# Patient Record
Sex: Male | Born: 1950 | Race: White | Hispanic: No | Smoking: Former smoker
Health system: Southern US, Community
[De-identification: ages and names within clinical notes are randomized; demographics above are authoritative.]

## PROBLEM LIST (undated history)

## (undated) DIAGNOSIS — J449 Chronic obstructive pulmonary disease, unspecified: Secondary | ICD-10-CM

## (undated) HISTORY — PX: NO PAST SURGERIES: SHX2092

---

## 2020-12-17 ENCOUNTER — Other Ambulatory Visit: Payer: Self-pay

## 2020-12-17 ENCOUNTER — Ambulatory Visit
Admission: EM | Admit: 2020-12-17 | Discharge: 2020-12-17 | Disposition: A | Discharge status: Home / Self Care | Payer: Medicare HMO | Attending: Family Medicine | Admitting: Family Medicine

## 2020-12-17 DIAGNOSIS — L270 Generalized skin eruption due to drugs and medicaments taken internally: Secondary | ICD-10-CM | POA: Diagnosis not present

## 2020-12-17 HISTORY — DX: Chronic obstructive pulmonary disease, unspecified: J44.9

## 2020-12-17 MED ORDER — PREDNISONE 10 MG PO TABS: ORAL_TABLET | ORAL | 0 refills | Status: DC

## 2020-12-17 MED ORDER — CETIRIZINE HCL 10 MG PO TABS: 10.0000 mg | ORAL_TABLET | Freq: Every day | ORAL | 0 refills | Status: DC

## 2020-12-17 NOTE — ED Triage Notes (Addendum)
Patient states that he has a rash that started 2 days ago. Patient states that he started an antibiotic around 7 days ago. States that he stopped taking it since he believes the rash may have been caused by antibiotic. The rash is all over his body. Patient states that he took some of his daughter in laws medication for his tooth infection, states that the medication was doxycycline.

## 2020-12-18 NOTE — ED Provider Notes (Signed)
[facility]    CSN: [id number] Arrival date & time: 12/17/20  1544      History   Chief Complaint Chief Complaint  Patient presents with  . Rash  . Allergic Reaction   HPI  70 year old male presents with the above complaints.  Patient reports that his rash started 2 days ago.  He recently took several days of an antibiotic that he got from his daughter.  Patient reports that he took clindamycin for a "gum issue".  He now has a diffuse, raised rash.  It is essentially located everywhere.  He has had some itching.  No relieving factors.  No other reported symptoms.  Past Medical History:  Diagnosis Date  . COPD (chronic obstructive pulmonary disease) ([place])    Past Surgical History:  Procedure Laterality Date  . NO PAST SURGERIES      Home Medications    Prior to Admission medications   Medication Sig Start Date End Date Taking? Authorizing Provider  Albuterol Sulfate, sensor, (PROAIR DIGIHALER) 108 (90 Base) MCG/ACT AEPB Inhale into the lungs.   Yes [provider]  cetirizine (ZYRTEC ALLERGY) 10 MG tablet Take 1 tablet (10 mg total) by mouth daily. 12/17/20  Yes Lavin Petteway G, DO  ipratropium-albuterol (DUONEB) 0.5-2.5 (3) MG/3ML SOLN Take by nebulization. 11/15/20  Yes [provider]  OXYGEN Inhale into the lungs. 2-5 liters depending on activity   Yes [provider]  predniSONE (DELTASONE) 10 MG tablet 50 mg daily x 2 days, then 40 mg daily x 2 days, then 30 mg daily x 2 days, then 20 mg daily x 2 days, then 10 mg daily x 2 days. 12/17/20  Yes Coral Spikes, DO    Family History Family History  Problem Relation Age of Onset  . Breast cancer Mother   . Alcoholism Father     Social History Social History   Tobacco Use  . Smoking status: Current Every Day Smoker    Packs/day: 0.50    Types: Cigarettes  . Smokeless tobacco: Never Used  Vaping Use  . Vaping Use: Never used  Substance Use Topics  . Alcohol use: Yes     Comment: beers daily  . Drug use: Not Currently     Allergies   Sulfa antibiotics   Review of Systems Review of Systems  Constitutional: Negative for fever.  Skin: Positive for rash.    Physical Exam Triage Vital Signs ED Triage Vitals [12/17/20 1754]  Enc Vitals Group     BP      Pulse      Resp      Temp      Temp src      SpO2      Weight 190 lb (86.2 kg)     Height 5\' 8"  (1.727 m)     Head Circumference      Peak Flow      Pain Score 3     Pain Loc      Pain Edu?      Excl. in [place]?    Updated Vital Signs Ht 5\' 8"  (1.727 m)   Wt 86.2 kg   BMI 28.89 kg/m   Visual Acuity Right Eye Distance:   Left Eye Distance:   Bilateral Distance:    Right Eye Near:   Left Eye Near:    Bilateral Near:     Physical Exam Vitals and nursing note reviewed.  Constitutional:      Comments: Chronically ill-appearing.  Nasal  cannula oxygen in place.  HENT:     Head: Normocephalic and atraumatic.  Eyes:     General:        Right eye: No discharge.        Left eye: No discharge.     Conjunctiva/sclera: Conjunctivae normal.  Pulmonary:     Effort: No respiratory distress.  Skin:    Comments: Diffuse, raised, erythematous rash.  Neurological:     Mental Status: He is alert.  Psychiatric:        Mood and Affect: Mood normal.        Behavior: Behavior normal.    UC Treatments / Results  Labs (all labs ordered are listed, but only abnormal results are displayed) Labs Reviewed - No data to display  EKG   Radiology No results found.  Procedures Procedures (including critical care time)  Medications Ordered in UC Medications - No data to display  Initial Impression / Assessment and Plan / UC Course  I have reviewed the triage vital signs and the nursing notes.  Pertinent labs & imaging results that were available during my care of the patient were reviewed by me and considered in my medical decision making (see chart for details).    70 year old male  presents with a drug rash. Stop clindamycin. Treating with zyrtec and prednisone.   Final Clinical Impressions(s) / UC Diagnoses   Final diagnoses:  Drug rash   Discharge Instructions   None    ED Prescriptions    Medication Sig Dispense Auth. Provider   predniSONE (DELTASONE) 10 MG tablet 50 mg daily x 2 days, then 40 mg daily x 2 days, then 30 mg daily x 2 days, then 20 mg daily x 2 days, then 10 mg daily x 2 days. 30 tablet Nabil Bubolz G, DO   cetirizine (ZYRTEC ALLERGY) 10 MG tablet Take 1 tablet (10 mg total) by mouth daily. 14 tablet Coral Spikes, DO     PDMP not reviewed this encounter.   Thersa Salt [place], [place] 12/18/20 [phone]

## 2021-01-05 ENCOUNTER — Institutional Professional Consult (permissible substitution): Payer: Self-pay | Admitting: Pulmonary Disease

## 2021-01-05 NOTE — Progress Notes (Deleted)
   Synopsis: Referred in *** for *** by No ref. provider found  Subjective:   PATIENT ID: Jimmy Welch GENDER: male DOB: Sep 17, 1951, MRN: [id number]  No chief complaint on file.   HPI  ***  No past medical history on file.   No family history on file.   *** The histories are not reviewed yet. Please review them in the "History" navigator section and refresh this [place].  Social History   Socioeconomic History  . Marital status: Not on file    Spouse name: Not on file  . Number of children: Not on file  . Years of education: Not on file  . Highest education level: Not on file  Occupational History  . Not on file  Tobacco Use  . Smoking status: Not on file  . Smokeless tobacco: Not on file  Substance and Sexual Activity  . Alcohol use: Not on file  . Drug use: Not on file  . Sexual activity: Not on file  Other Topics Concern  . Not on file  Social History Narrative  . Not on file   Social Determinants of Health   Financial Resource Strain: Not on file  Food Insecurity: Not on file  Transportation Needs: Not on file  Physical Activity: Not on file  Stress: Not on file  Social Connections: Not on file  Intimate Partner Violence: Not on file     Not on File   No outpatient medications prior to visit.   No facility-administered medications prior to visit.    ROS   Objective:  Physical Exam   There were no vitals filed for this visit.   on *** LPM *** RA BMI Readings from Last 3 Encounters:  No data found for BMI   Wt Readings from Last 3 Encounters:  No data found for Wt     CBC No results found for: WBC, RBC, HGB, HCT, PLT, MCV, MCH, MCHC, RDW, LYMPHSABS, MONOABS, EOSABS, BASOSABS  ***  Chest Imaging: ***  Pulmonary Functions Testing Results: No flowsheet data found.  FeNO: ***  Pathology: ***  Echocardiogram: ***  Heart Catheterization: ***    Assessment & Plan:   No diagnosis found.  Discussion: ***  No current  outpatient medications on file.  I spent *** minutes dedicated to the care of this patient on the date of this encounter to include pre-visit review of records, face-to-face time with the patient discussing conditions above, post visit ordering of testing, clinical documentation with the electronic health record, making appropriate referrals as documented, and communicating necessary findings to members of the patients care team.   Garner Nash, DO [facility] 01/05/2021 10:57 AM

## 2021-02-08 ENCOUNTER — Encounter: Payer: Self-pay | Admitting: Internal Medicine

## 2021-02-08 ENCOUNTER — Other Ambulatory Visit: Payer: Self-pay

## 2021-02-08 ENCOUNTER — Ambulatory Visit: Payer: Medicare HMO | Admitting: Internal Medicine

## 2021-02-08 ENCOUNTER — Telehealth: Payer: Self-pay | Admitting: Internal Medicine

## 2021-02-08 VITALS — BP 130/78 | HR 97 | Temp 97.3°F | Ht 68.0 in | Wt 219.2 lb

## 2021-02-08 DIAGNOSIS — F17210 Nicotine dependence, cigarettes, uncomplicated: Secondary | ICD-10-CM

## 2021-02-08 DIAGNOSIS — F17200 Nicotine dependence, unspecified, uncomplicated: Secondary | ICD-10-CM

## 2021-02-08 DIAGNOSIS — J449 Chronic obstructive pulmonary disease, unspecified: Secondary | ICD-10-CM

## 2021-02-08 NOTE — Progress Notes (Signed)
Name: Jimmy Welch MRN: [id number] DOB: Aug 07, 1951     CONSULTATION DATE: 02/08/2021    CHIEF COMPLAINT:SOB  HISTORY OF PRESENT ILLNESS: 70 year old white male with a newly diagnosis of COPD since 2020 with an extensive medical history and admission to the hospital in the intensive care unit in October 2020 for severe COPD exacerbation from pneumonia  On discharge patient has significant amount of respiratory dysfunction and respiratory insufficiency requiring 4 to 5 L of oxygen with exertion along with 1 to 2 L at nighttime  Patient has tried multiple traditional inhaler therapies which did not work and now has found significant amount of respiratory relief with  duo nebs every 6 hours   No exacerbation at this time No evidence of heart failure at this time No evidence or signs of infection at this time No respiratory distress No fevers, chills, nausea, vomiting, diarrhea No evidence of lower extremity edema No evidence hemoptysis  At this time patient has progressive respiratory insufficiency and poor respiratory effort with dyspnea exertion  I will need to get his records from Dr. Azucena Freed pulmonary medicine from [place] Once we receive records will obtain further testing if needed   Smoking Assessment and Cessation Counseling Upon further questioning, Patient smokes 1 ppd I have advised patient to quit/stop smoking as soon as possible due to high risk for multiple medical problems  Patient is willing to quit smoking  I have advised patient that we can assist and have options of Nicotine replacement therapy. I also advised patient on behavioral therapy and can provide oral medication therapy in conjunction with the other therapies Follow up next Office visit  for assessment of smoking cessation Smoking cessation counseling advised for 4 minutes      PAST MEDICAL HISTORY :   has a past medical history of COPD (chronic obstructive pulmonary disease) ([place]).   has a past surgical history that includes No past surgeries. Prior to Admission medications   Medication Sig Start Date End Date Taking? Authorizing Provider  Albuterol Sulfate, sensor, (PROAIR DIGIHALER) 108 (90 Base) MCG/ACT AEPB Inhale into the lungs.   Yes [provider]  ipratropium-albuterol (DUONEB) 0.5-2.5 (3) MG/3ML SOLN Take by nebulization. 11/15/20  Yes [provider]  OXYGEN Inhale into the lungs. 2-5 liters depending on activity   Yes [provider]  cetirizine (ZYRTEC ALLERGY) 10 MG tablet Take 1 tablet (10 mg total) by mouth daily. Patient not taking: Reported on 02/08/2021 12/17/20   Coral Spikes, DO   Allergies  Allergen Reactions  . Sulfa Antibiotics     FAMILY HISTORY:  family history includes Alcoholism in his father; Breast cancer in his mother. SOCIAL HISTORY:  reports that he has been smoking cigarettes. He has been smoking about 0.50 packs per day. He has never used smokeless tobacco. He reports current alcohol use. He reports previous drug use.    Review of Systems:  Gen:  Denies  fever, sweats, chills weigh loss  HEENT: Denies blurred vision, double vision, ear pain, eye pain, hearing loss, nose bleeds, sore throat Cardiac:  No dizziness, chest pain or heaviness, chest tightness,edema, No JVD Resp:   No cough, -sputum production, +shortness of breath,-wheezing, -hemoptysis,  Gi: Denies swallowing difficulty, stomach pain, nausea or vomiting, diarrhea, constipation, bowel incontinence Gu:  Denies bladder incontinence, burning urine Ext:   Denies Joint pain, stiffness or swelling Skin: Denies  skin rash, easy bruising or bleeding or hives Endoc:  Denies polyuria, polydipsia , polyphagia or weight change Psych:  Denies depression, insomnia or hallucinations  Other:  All other systems negative    BP 130/78 (BP Location: Left Arm, Cuff Size: Normal)   Pulse 97   Temp (!) 97.3 F (36.3 C) (Temporal)   Ht 5\' 8"  (1.727 m)    Wt 219 lb 3.2 oz (99.4 kg)   SpO2 99%   BMI 33.33 kg/m    Physical Examination:   GENERAL:NAD, no fevers, chills, no weakness no fatigue HEAD: Normocephalic, atraumatic.  EYES: PERLA, EOMI No scleral icterus.  MOUTH: Moist mucosal membrane.  EAR, NOSE, THROAT: Clear without exudates. No external lesions.  NECK: Supple.  PULMONARY: CTA B/L no wheezing, rhonchi, crackles CARDIOVASCULAR: S1 and S2. Regular rate and rhythm. No murmurs GASTROINTESTINAL: Soft, nontender, nondistended. Positive bowel sounds.  MUSCULOSKELETAL: No swelling, clubbing, or edema.  NEUROLOGIC: No gross focal neurological deficits. 5/5 strength all extremities SKIN: No ulceration, lesions, rashes, or cyanosis.  PSYCHIATRIC: Insight, judgment intact. -depression -anxiety ALL OTHER ROS ARE NEGATIVE   MEDICATIONS: I have reviewed all medications and confirmed regimen as documented      ASSESSMENT AND PLAN SYNOPSIS 70 year old morbidly obese white male with significant respiratory insufficiency from COPD with a significant medical history of acute exacerbation with pneumonia and requiring the ventilator several years ago with chronic persistent shortness of breath and dyspnea exertion and poor respiratory insufficiency requiring continued oxygen therapy  COPD Obtain records from previous pulmonary office Patient with significant respiratory compromise from COPD Nebulized therapy is the only therapy that works at this time Traditional inhaler therapy has been tried and has not worked [employer] every 6 hours  Chronic hypoxic respiratory failure from COPD Continue oxygen as prescribed Patient uses and benefits from oxygen therapy He needs this for survival Patient is asking for portable oxygen concentrator   Smoking cessation strongly advised  Patient meets criteria for lung cancer screening protocol  COVID-19 EDUCATION: The signs and symptoms of COVID-19 were discussed with the patient and how to seek  care for testing.  The importance of social distancing was discussed today. Hand Washing Techniques and avoid touching face was advised.     MEDICATION ADJUSTMENTS/LABS AND TESTS ORDERED: Obtain records from pulmonary office Continue oxygen as prescribed Portable oxygen concentrator Continue nebulizer therapy with DuoNeb's   CURRENT MEDICATIONS REVIEWED AT LENGTH WITH PATIENT TODAY   Patient satisfied with Plan of action and management. All questions answered  Follow up in 6 months Total time spent 48 minutes   Corrin Parker, M.D.  [facility]  Medical Director [place] Director [facility] Cardio-Pulmonary Department

## 2021-02-08 NOTE — Patient Instructions (Addendum)
Continue oxygen as prescribed Continue nebulized therapy with duo nebs every 6 hours Patient meets criteria for lung cancer screening protocol Assess patient's DME company for portable oxygen concentrator Obtain records from Dr. Azucena Freed pulmonary medicine office  Lung cancer screening referral

## 2021-02-08 NOTE — Telephone Encounter (Signed)
Record request has been faxed to [facility].  Will await records.

## 2021-02-15 NOTE — Telephone Encounter (Signed)
Re faxed request to [employer].

## 2021-02-23 ENCOUNTER — Telehealth: Payer: Self-pay | Admitting: *Deleted

## 2021-02-23 DIAGNOSIS — Z122 Encounter for screening for malignant neoplasm of respiratory organs: Secondary | ICD-10-CM

## 2021-02-23 DIAGNOSIS — Z87891 Personal history of nicotine dependence: Secondary | ICD-10-CM

## 2021-02-23 DIAGNOSIS — F17200 Nicotine dependence, unspecified, uncomplicated: Secondary | ICD-10-CM

## 2021-02-23 NOTE — Telephone Encounter (Signed)
Received referral for initial lung cancer screening scan. Contacted patient and obtained smoking history,(current 40 pack year) as well as answering questions related to screening process. Patient denies signs of lung cancer such as weight loss or hemoptysis. Patient denies comorbidity that would prevent curative treatment if lung cancer were found. Patient is scheduled for shared decision making visit and CT scan on 03/04/21.

## 2021-03-02 NOTE — Telephone Encounter (Signed)
Records have not been received.   I have spoken to caleb with [facility] health and requested update. Hetty Blend stated that request was received and forwarded to [employer].

## 2021-03-04 ENCOUNTER — Ambulatory Visit
Admission: RE | Admit: 2021-03-04 | Discharge: 2021-03-04 | Disposition: A | Discharge status: Home / Self Care | Payer: Medicare HMO | Source: Ambulatory Visit | Attending: Nurse Practitioner | Admitting: Nurse Practitioner

## 2021-03-04 ENCOUNTER — Other Ambulatory Visit: Payer: Self-pay

## 2021-03-04 ENCOUNTER — Inpatient Hospital Stay: Payer: Medicare HMO | Attending: Nurse Practitioner | Admitting: Nurse Practitioner

## 2021-03-04 DIAGNOSIS — Z122 Encounter for screening for malignant neoplasm of respiratory organs: Secondary | ICD-10-CM | POA: Diagnosis present

## 2021-03-04 DIAGNOSIS — Z87891 Personal history of nicotine dependence: Secondary | ICD-10-CM | POA: Insufficient documentation

## 2021-03-04 DIAGNOSIS — F17200 Nicotine dependence, unspecified, uncomplicated: Secondary | ICD-10-CM | POA: Diagnosis present

## 2021-03-04 NOTE — Progress Notes (Signed)
Virtual Visit via Video Enabled Telemedicine Note   I connected with Jimmy Welch on 03/04/21 at 11:30 am EST by video enabled telemedicine visit and verified that I am speaking with the correct person using two identifiers.   I discussed the limitations, risks, security and privacy concerns of performing an evaluation and management service by telemedicine and the availability of in-person appointments. I also discussed with the patient that there may be a patient responsible charge related to this service. The patient expressed understanding and agreed to proceed.   Other persons participating in the visit and their role in the encounter: Burgess Estelle, RN- checking in patient & navigation  Patient's location: home  Provider's location: Clinic  Chief Complaint: Low Dose CT Screening  Patient agreed to evaluation by telemedicine to discuss shared decision making for consideration of low dose CT lung cancer screening.    In accordance with CMS guidelines, patient has met eligibility criteria including age, absence of signs or symptoms of lung cancer.  Social History   Tobacco Use  . Smoking status: Current Every Day Smoker    Packs/day: 1.00    Years: 40.00    Pack years: 40.00    Types: Cigarettes  . Smokeless tobacco: Never Used  . Tobacco comment: 1 pack per week--02/08/2021  Substance Use Topics  . Alcohol use: Yes    Comment: beers daily     A shared decision-making session was conducted prior to the performance of CT scan. This includes one or more decision aids, includes benefits and harms of screening, follow-up diagnostic testing, over-diagnosis, false positive rate, and total radiation exposure.   Counseling on the importance of adherence to annual lung cancer LDCT screening, impact of co-morbidities, and ability or willingness to undergo diagnosis and treatment is imperative for compliance of the program.   Counseling on the importance of continued smoking  cessation for former smokers; the importance of smoking cessation for current smokers, and information about tobacco cessation interventions have been given to patient including [place] and [employer] Quit [place] programs.   Written order for lung cancer screening with LDCT has been given to the patient and any and all questions have been answered to the best of my abilities.    Yearly follow up will be coordinated by Burgess Estelle, Thoracic Navigator.  I discussed the assessment and treatment plan with the patient. The patient was provided an opportunity to ask questions and all were answered. The patient agreed with the plan and demonstrated an understanding of the instructions.   The patient was advised to call back or seek an in-person evaluation if the symptoms worsen or if the condition fails to improve as anticipated.   I provided 15 minutes of face-to-face video visit time dedicated to the care of this patient on the date of this encounter to include pre-visit review of smoking history, face-to-face time with the patient, and post visit ordering of testing/documentation.   Beckey Rutter, DNP, AGNP-C [place] at [facility] [phone] (clinic)

## 2021-03-09 NOTE — Telephone Encounter (Signed)
Lm for [facility] health medical records department.

## 2021-03-11 ENCOUNTER — Encounter: Payer: Self-pay | Admitting: *Deleted

## 2021-03-17 NOTE — Telephone Encounter (Signed)
Records have not been received.  Lm for [facility].

## 2021-03-18 NOTE — Telephone Encounter (Signed)
Spoke to [employer] records and requested records.

## 2021-03-30 NOTE — Telephone Encounter (Signed)
Records have been received and placed in Dr. Zoila Shutter folder

## 2021-03-31 ENCOUNTER — Telehealth: Payer: Self-pay | Admitting: Internal Medicine

## 2021-03-31 DIAGNOSIS — Z0289 Encounter for other administrative examinations: Secondary | ICD-10-CM

## 2021-04-05 NOTE — Telephone Encounter (Signed)
Kathlee Nations dropped charge - called and collected payment over the phone form patient. Mailed forms to [place] [employer] in envelope provided, mailed copy to patient per his request. -pr

## 2021-06-30 ENCOUNTER — Encounter: Payer: Self-pay | Admitting: Family Medicine

## 2021-08-25 ENCOUNTER — Other Ambulatory Visit: Payer: Self-pay

## 2021-08-25 ENCOUNTER — Encounter: Payer: Self-pay | Admitting: Gastroenterology

## 2021-08-25 ENCOUNTER — Ambulatory Visit: Payer: Medicare Other | Admitting: Gastroenterology

## 2021-08-25 VITALS — BP 143/72 | HR 85 | Temp 98.0°F | Ht 68.0 in | Wt 208.0 lb

## 2021-08-25 DIAGNOSIS — Z8709 Personal history of other diseases of the respiratory system: Secondary | ICD-10-CM | POA: Diagnosis not present

## 2021-08-25 DIAGNOSIS — R195 Other fecal abnormalities: Secondary | ICD-10-CM

## 2021-08-25 MED ORDER — NA SULFATE-K SULFATE-MG SULF 17.5-3.13-1.6 GM/177ML PO SOLN: 354.0000 mL | Freq: Once | ORAL | 0 refills | Status: AC

## 2021-08-25 NOTE — Patient Instructions (Signed)
We will obtain Pulmonology Clearance from your pulmonologist and then we will call you when you could have your colonoscopy set-up.

## 2021-08-25 NOTE — Progress Notes (Signed)
Jonathon Bellows MD, MRCP(U.K) [address]  [place]  [place], [place] [address]  Main: [phone]  Fax: [phone]   Gastroenterology Consultation  Referring Provider:     [name], [place] Primary Care Physician:  [name], [place] Primary Gastroenterologist:  Dr. Jonathon Bellows  Reason for Consultation:   Blood in stool  HPI:   Jimmy Welch is a 70 y.o. y/o male referred for consultation & management  by  [name], [place].    On 06/17/2021 stool occult testing was positive for blood.  He denies any blood in the stool.  No change in bowel habits.  Last colonoscopy was 10 years back.  Denies any polyps in the last procedure.  He is on oxygen for the past 18 months 24 hours a day.  Was commenced on the same after bad episode of pneumonia.  Denies any shortness of breath at rest.   Past Medical History:  Diagnosis Date   COPD (chronic obstructive pulmonary disease) ([place])     Past Surgical History:  Procedure Laterality Date   NO PAST SURGERIES      Prior to Admission medications   Medication Sig Start Date End Date Taking? Authorizing Provider  Albuterol Sulfate, sensor, (PROAIR DIGIHALER) 108 (90 Base) MCG/ACT AEPB Inhale into the lungs.   Yes [provider]  baclofen (LIORESAL) 10 MG tablet Take 10 mg by mouth 3 (three) times daily. 07/12/21  Yes [provider]  cetirizine (ZYRTEC ALLERGY) 10 MG tablet Take 1 tablet (10 mg total) by mouth daily. 12/17/20  Yes Cook, Jayce G, DO  ipratropium-albuterol (DUONEB) 0.5-2.5 (3) MG/3ML SOLN Take by nebulization. 11/15/20  Yes [provider]  lisinopril (ZESTRIL) 20 MG tablet Take 20 mg by mouth daily. 08/05/21  Yes [provider]  OXYGEN Inhale into the lungs. 2-5 liters depending on activity   Yes [provider]    Family History  Problem Relation Age of Onset   Breast cancer Mother    Alcoholism Father      Social History   Tobacco Use   Smoking status: Every Day     Packs/day: 1.00    Years: 40.00    Pack years: 40.00    Types: Cigarettes   Smokeless tobacco: Never   Tobacco comments:    1 pack per week--02/08/2021  Vaping Use   Vaping Use: Never used  Substance Use Topics   Alcohol use: Yes    Comment: beers daily   Drug use: Not Currently    Allergies as of 08/25/2021 - Review Complete 08/25/2021  Allergen Reaction Noted   Sulfa antibiotics  12/17/2020    Review of Systems:    All systems reviewed and negative except where noted in HPI.   Physical Exam:  BP (!) 143/72 (BP Location: Left Arm, Patient Position: Sitting, Cuff Size: Normal)   Pulse 85   Temp 98 F (36.7 C) (Oral)   Ht 5\' 8"  (1.727 m)   Wt 208 lb (94.3 kg)   BMI 31.63 kg/m  No LMP for male patient. Psych:  Alert and cooperative. Normal mood and affect. General:   Alert,  Well-developed, well-nourished, pleasant and cooperative in NAD Head:  Normocephalic and atraumatic. Eyes:  Sclera clear, no icterus.   Conjunctiva pink. Lungs: On oxygen with a cylinder by side.  Decreased air entry bilaterally but equal.  Using some accessory muscles of respiration to breathe. Heart:  Regular rate and rhythm; no murmurs, clicks, rubs, or gallops. Abdomen:  Normal  bowel sounds.  No bruits.  Soft, non-tender and non-distended without masses, hepatosplenomegaly or hernias noted.  No guarding or rebound tenderness.    Neurologic:  Alert and oriented x3;  grossly normal neurologically. Psych:  Alert and cooperative. Normal mood and affect.  Imaging Studies: No results found.  Assessment and Plan:   Jimmy Welch is a 70 y.o. y/o male has been referred for here today for stool test that was positive for blood. Unclear why it was checked.  Patient was not sure.  I explained to him that with his severe COPD and a procedure such as a colonoscopy requiring anesthesia the risk would definitely be higher than usual due to his impaired functioning of the lungs.  He decided to go ahead  with the procedure.  We will obtain pulmonary clearance and proceed with the procedure.  I have discussed alternative options, risks & benefits,  which include, but are not limited to, bleeding, infection, perforation,respiratory complication & drug reaction.  The patient agrees with this plan & written consent will be obtained.    Follow up in as needed  Dr Jonathon Bellows MD,MRCP(U.K)

## 2021-09-08 ENCOUNTER — Ambulatory Visit: Payer: Medicare HMO | Admitting: Adult Health

## 2021-10-03 ENCOUNTER — Encounter: Payer: Self-pay | Admitting: Primary Care

## 2021-10-03 ENCOUNTER — Other Ambulatory Visit: Payer: Self-pay

## 2021-10-03 ENCOUNTER — Ambulatory Visit
Admission: RE | Admit: 2021-10-03 | Discharge: 2021-10-03 | Disposition: A | Discharge status: Home / Self Care | Payer: Medicare Other | Source: Ambulatory Visit | Attending: Primary Care | Admitting: Primary Care

## 2021-10-03 ENCOUNTER — Ambulatory Visit (INDEPENDENT_AMBULATORY_CARE_PROVIDER_SITE_OTHER): Payer: Medicare Other | Admitting: Primary Care

## 2021-10-03 VITALS — BP 128/60 | HR 77 | Temp 97.7°F | Ht 68.0 in | Wt 202.0 lb

## 2021-10-03 DIAGNOSIS — J449 Chronic obstructive pulmonary disease, unspecified: Secondary | ICD-10-CM | POA: Diagnosis not present

## 2021-10-03 DIAGNOSIS — Z01811 Encounter for preprocedural respiratory examination: Secondary | ICD-10-CM

## 2021-10-03 DIAGNOSIS — J9611 Chronic respiratory failure with hypoxia: Secondary | ICD-10-CM | POA: Diagnosis not present

## 2021-10-03 NOTE — Assessment & Plan Note (Signed)
-   Stable interval - Continue ipratropium-albuterol nebulizer QID - Strongly encourage he quit smoking - Needs pre-op CXR and due for PFTs - FU in 3 months or sooner if needed

## 2021-10-03 NOTE — Progress Notes (Signed)
@Patient  ID: Jimmy Welch, male    DOB: 20-Apr-1951, 70 y.o.   MRN: [id number]  Chief Complaint  Patient presents with   Follow-up    Referring provider: [name], PA  HPI: 70 year old male, current everyday smoker.  Past medical history significant for COPD, chronic respiratory failure.  Patient of Dr. Mortimer Fries, last seen for initial consult on 02/08/2021.  10/03/2021- Interim hx  Patient presents today for overdue 6 month follow-up. He is doing well today, no acute complaints. His breathing has significantly improved over the last several months. He has lost 20 lbs since August by walking on his treadmill. He is not able to afford traditional inhalers. He uses duoneb 3-4 times a day.  He feels this regimen has been working well for him. He is on continuous oxygen, uses 2L at rest and 5L with moderate-heavy exertion.  He has an intermittent chronic cough and dry throat, needs several cough drop throughout the day. He is still smoking, 1 pack last him a week.He is needing letter for [facility] today stating that he is disabled and needs handicap placard form. He does not drive, his son will drive him. Needs clearance for colonoscopy, date TBD.    Allergies  Allergen Reactions   Sulfa Antibiotics     Immunization History  Administered Date(s) Administered   Moderna SARS-COV2 Booster Vaccination 01/05/2021   Moderna Sars-Covid-2 Vaccination 03/10/2020, 04/07/2020    Past Medical History:  Diagnosis Date   COPD (chronic obstructive pulmonary disease) ([place])     Tobacco History: Social History   Tobacco Use  Smoking Status Every Day   Packs/day: 1.00   Years: 40.00   Pack years: 40.00   Types: Cigarettes  Smokeless Tobacco Never  Tobacco Comments   1 pack per week--02/08/2021   Ready to quit: Not Answered Counseling given: Not Answered Tobacco comments: 1 pack per week--02/08/2021   Outpatient Medications Prior to Visit  Medication Sig Dispense Refill   Albuterol  Sulfate, sensor, (PROAIR DIGIHALER) 108 (90 Base) MCG/ACT AEPB Inhale into the lungs.     baclofen (LIORESAL) 10 MG tablet Take 10 mg by mouth 3 (three) times daily.     cetirizine (ZYRTEC ALLERGY) 10 MG tablet Take 1 tablet (10 mg total) by mouth daily. 14 tablet 0   ipratropium-albuterol (DUONEB) 0.5-2.5 (3) MG/3ML SOLN Take by nebulization.     lisinopril (ZESTRIL) 20 MG tablet Take 20 mg by mouth daily.     methylPREDNISolone (MEDROL) 4 MG tablet Take by mouth.     OXYGEN Inhale into the lungs. 2-5 liters depending on activity     No facility-administered medications prior to visit.   Review of Systems  Review of Systems  Constitutional: Negative.   HENT: Negative.    Respiratory:  Positive for cough. Negative for chest tightness, shortness of breath and wheezing.     Physical Exam  BP 128/60 (BP Location: Left Arm, Patient Position: Sitting, Cuff Size: Normal)   Pulse 77   Temp 97.7 F (36.5 C) (Oral)   Ht 5\' 8"  (1.727 m)   Wt 202 lb (91.6 kg)   SpO2 95%   BMI 30.71 kg/m  Physical Exam Constitutional:      General: He is not in acute distress.    Appearance: Normal appearance. He is not ill-appearing.  HENT:     Head: Normocephalic and atraumatic.     Mouth/Throat:     Mouth: Mucous membranes are moist.     Pharynx: Oropharynx is clear.  Cardiovascular:  Rate and Rhythm: Normal rate and regular rhythm.  Pulmonary:     Effort: Pulmonary effort is normal. No respiratory distress.     Breath sounds: No stridor. No rhonchi or rales.     Comments: Faint wheeze upper lobes Musculoskeletal:        General: Normal range of motion.     Cervical back: Normal range of motion and neck supple.  Skin:    General: Skin is warm and dry.  Neurological:     General: No focal deficit present.     Mental Status: He is alert and oriented to person, place, and time. Mental status is at baseline.     Lab Results:  CBC No results found for: WBC, RBC, HGB, HCT, PLT, MCV, MCH,  MCHC, RDW, LYMPHSABS, MONOABS, EOSABS, BASOSABS  BMET No results found for: NA, K, CL, CO2, GLUCOSE, BUN, CREATININE, CALCIUM, GFRNONAA, GFRAA  BNP No results found for: BNP  ProBNP No results found for: PROBNP  Imaging: No results found.   Assessment & Plan:   COPD (chronic obstructive pulmonary disease) ([place]) - Stable interval - Continue ipratropium-albuterol nebulizer QID - Strongly encourage he quit smoking - Needs pre-op CXR and due for PFTs - FU in 3 months or sooner if needed   Preop respiratory exam - Patient needs colonoscopy, date to be determined. Patient will be considered intermediate risk for prolonged mech ventilation and/or post op pulmonary complications due to hx copd and chronic respiratory failure. Checking pre-op CXR and needs updated PFTs.  From pulmonary standpoint he is optimized for procedure. Would recommend colonoscopy be done in hospital setting if able.  Major Pulmonary risks identified in the multifactorial risk analysis are but not limited to a) pneumonia; b) recurrent intubation risk; c) prolonged or recurrent acute respiratory failure needing mechanical ventilation; d) prolonged hospitalization; e) DVT/Pulmonary embolism; f) Acute Pulmonary edema  Recommend 1. Short duration of surgery as much as possible and avoid paralytic if possible 2. Recovery in step down or ICU with Pulmonary consultation if needed 3. DVT prophylaxis 4. Aggressive pulmonary toilet with o2, bronchodilatation, and incentive spirometry and early ambulation   Chronic respiratory failure with hypoxia ([place]) - Continue supplemental oxygen 2L at rest and 5L with exertion    Martyn Ehrich, NP 10/03/2021

## 2021-10-03 NOTE — Assessment & Plan Note (Signed)
-   Continue supplemental oxygen 2L at rest and 5L with exertion

## 2021-10-03 NOTE — Assessment & Plan Note (Signed)
-   Patient needs colonoscopy, date to be determined. Patient will be considered intermediate risk for prolonged mech ventilation and/or post op pulmonary complications due to hx copd and chronic respiratory failure. Checking pre-op CXR and needs updated PFTs.  From pulmonary standpoint he is optimized for procedure. Would recommend colonoscopy be done in hospital setting if able.  Major Pulmonary risks identified in the multifactorial risk analysis are but not limited to a) pneumonia; b) recurrent intubation risk; c) prolonged or recurrent acute respiratory failure needing mechanical ventilation; d) prolonged hospitalization; e) DVT/Pulmonary embolism; f) Acute Pulmonary edema  Recommend 1. Short duration of surgery as much as possible and avoid paralytic if possible 2. Recovery in step down or ICU with Pulmonary consultation if needed 3. DVT prophylaxis 4. Aggressive pulmonary toilet with o2, bronchodilatation, and incentive spirometry and early ambulation

## 2021-10-03 NOTE — Patient Instructions (Addendum)
Recommendations: Continue to use Ipratropium-albuterol nebulizer 4 times a day Encourage you quit smoking completely   Orders: CXR and PFTs Please provide patient with DMV paperwork   Follow-up: 3 months with Dr. Mortimer Fries

## 2021-10-04 NOTE — Progress Notes (Signed)
CXR showed no acute abnormality; emphysema and scarring right lung base (these were seen on LDCT)

## 2021-10-07 ENCOUNTER — Telehealth: Payer: Self-pay

## 2021-10-07 NOTE — Telephone Encounter (Signed)
Called and spoke to patient about upcoming Covid test. Nothing further needed.

## 2021-10-11 ENCOUNTER — Other Ambulatory Visit
Admission: RE | Admit: 2021-10-11 | Discharge: 2021-10-11 | Disposition: A | Discharge status: Home / Self Care | Payer: Medicare Other | Source: Ambulatory Visit | Attending: Primary Care | Admitting: Primary Care

## 2021-10-11 ENCOUNTER — Other Ambulatory Visit: Payer: Self-pay

## 2021-10-11 DIAGNOSIS — Z20822 Contact with and (suspected) exposure to covid-19: Secondary | ICD-10-CM | POA: Diagnosis not present

## 2021-10-11 DIAGNOSIS — Z01812 Encounter for preprocedural laboratory examination: Secondary | ICD-10-CM | POA: Insufficient documentation

## 2021-10-11 LAB — SARS CORONAVIRUS 2 (TAT 6-24 HRS): SARS Coronavirus 2: NEGATIVE

## 2021-10-12 ENCOUNTER — Ambulatory Visit: Payer: Medicare Other | Attending: Primary Care

## 2021-10-12 DIAGNOSIS — J449 Chronic obstructive pulmonary disease, unspecified: Secondary | ICD-10-CM | POA: Diagnosis not present

## 2021-10-12 MED ORDER — ALBUTEROL SULFATE (2.5 MG/3ML) 0.083% IN NEBU
2.5000 mg | INHALATION_SOLUTION | Freq: Once | RESPIRATORY_TRACT | Status: AC
  Administered 2021-10-12: 13:00:00 2.5 mg via RESPIRATORY_TRACT
  Filled 2021-10-12: qty 3

## 2021-10-13 ENCOUNTER — Telehealth: Payer: Self-pay

## 2021-10-13 MED ORDER — BREZTRI AEROSPHERE 160-9-4.8 MCG/ACT IN AERO: 2.0000 | INHALATION_SPRAY | Freq: Two times a day (BID) | RESPIRATORY_TRACT | 3 refills | Status: DC

## 2021-10-13 NOTE — Progress Notes (Signed)
PFTs showed very severe COPD. Does he have the ability to use inhalers? If so would start him on [facility]

## 2021-10-13 NOTE — Telephone Encounter (Addendum)
Called and spoke to patient in regards to test results, patient had a clear understanding. Was open to trying breztri. Placed the order. Nothing further needed.

## 2021-11-29 ENCOUNTER — Telehealth: Payer: Self-pay

## 2021-11-29 ENCOUNTER — Other Ambulatory Visit: Payer: Self-pay

## 2021-11-29 NOTE — Telephone Encounter (Signed)
Received refill RX for [facility] can not refill Breztri due to being to early. Nothing further needed.

## 2022-01-25 ENCOUNTER — Ambulatory Visit: Payer: Medicare Other | Admitting: Internal Medicine

## 2022-03-22 ENCOUNTER — Ambulatory Visit: Payer: Medicare Other | Admitting: Internal Medicine

## 2022-04-10 ENCOUNTER — Other Ambulatory Visit: Payer: Self-pay | Admitting: *Deleted

## 2022-04-10 DIAGNOSIS — Z87891 Personal history of nicotine dependence: Secondary | ICD-10-CM

## 2022-04-10 DIAGNOSIS — Z122 Encounter for screening for malignant neoplasm of respiratory organs: Secondary | ICD-10-CM

## 2022-04-25 ENCOUNTER — Ambulatory Visit
Admission: RE | Admit: 2022-04-25 | Discharge: 2022-04-25 | Disposition: A | Discharge status: Home / Self Care | Payer: Medicare HMO | Source: Ambulatory Visit | Attending: Acute Care | Admitting: Acute Care

## 2022-04-25 DIAGNOSIS — J439 Emphysema, unspecified: Secondary | ICD-10-CM | POA: Insufficient documentation

## 2022-04-25 DIAGNOSIS — Z87891 Personal history of nicotine dependence: Secondary | ICD-10-CM | POA: Diagnosis not present

## 2022-04-25 DIAGNOSIS — I700 Atherosclerosis of aorta: Secondary | ICD-10-CM | POA: Insufficient documentation

## 2022-04-25 DIAGNOSIS — I2510 Atherosclerotic heart disease of native coronary artery without angina pectoris: Secondary | ICD-10-CM | POA: Diagnosis not present

## 2022-04-25 DIAGNOSIS — Z122 Encounter for screening for malignant neoplasm of respiratory organs: Secondary | ICD-10-CM | POA: Diagnosis present

## 2022-04-26 ENCOUNTER — Telehealth: Payer: Self-pay | Admitting: Acute Care

## 2022-04-26 DIAGNOSIS — R911 Solitary pulmonary nodule: Secondary | ICD-10-CM

## 2022-04-26 NOTE — Telephone Encounter (Signed)
Will schedule OV after PET has been scheduled.  ?

## 2022-04-26 NOTE — Telephone Encounter (Signed)
I have calle the patient with the results of his low dose CT Chest. I explained that the scan was read as a Lung RADS 4 B indicates suspicious findings for which additional diagnostic testing and or tissue sampling is recommended.  ?I explained that I will order PET scan and PFT's. He is in agreement with this and understands that he will get a call to get thee scheduled.  ?Langley Gauss, please fax results to PCP and let them know plan is for PET scan and PFT's ?Margie, when you see PET has been scheduled, can you get him in with Dr. Darnell Level asap after?  ?Thanks so much ?

## 2022-04-26 NOTE — Telephone Encounter (Signed)
Call report from [facility] ? ?IMPRESSION: ?1. Lung-RADS 4B, suspicious. Additional imaging evaluation or ?consultation with Pulmonology or Thoracic Surgery recommended. ?Substantial interval growth of spiculated solid anterior right lower ?lobe pulmonary nodule, now 22.3 mm in volume derived mean diameter, ?highly suspicious for primary bronchogenic carcinoma. ?2. One vessel coronary atherosclerosis. ?3. Aortic Atherosclerosis (ICD10-I70.0) and Emphysema (ICD10-J43.9). ?  ?These results will be called to the ordering clinician or ?representative by the Radiologist Assistant, and communication ?documented in the PACS or [employer]. ?  ?  ?Electronically Signed ?  By: Ilona Sorrel M.D. ?  On: 04/26/2022 14:55 ?

## 2022-04-26 NOTE — Telephone Encounter (Signed)
Wrong patient. Message has been documented under the correct patient. Nothing further needed at this time ?

## 2022-04-27 NOTE — Telephone Encounter (Signed)
CT results and plan faxed to PCP ?

## 2022-04-27 NOTE — Telephone Encounter (Signed)
Spoke to patient and scheduled OV 05/10/2022 at 4:00. ?Nothing further needed. ? ?

## 2022-04-27 NOTE — Telephone Encounter (Signed)
Yes, please.

## 2022-04-27 NOTE — Telephone Encounter (Signed)
PET scheduled 05/08/2022. Dr. Patsey Berthold can we schedule patient to see you 05/10/2022 at 4:00? ?

## 2022-04-28 ENCOUNTER — Other Ambulatory Visit: Payer: Self-pay | Admitting: Acute Care

## 2022-04-28 DIAGNOSIS — R911 Solitary pulmonary nodule: Secondary | ICD-10-CM

## 2022-05-02 ENCOUNTER — Ambulatory Visit: Payer: Medicare HMO | Attending: Acute Care

## 2022-05-02 DIAGNOSIS — F17210 Nicotine dependence, cigarettes, uncomplicated: Secondary | ICD-10-CM | POA: Diagnosis not present

## 2022-05-02 DIAGNOSIS — R911 Solitary pulmonary nodule: Secondary | ICD-10-CM | POA: Insufficient documentation

## 2022-05-02 DIAGNOSIS — R0609 Other forms of dyspnea: Secondary | ICD-10-CM | POA: Insufficient documentation

## 2022-05-02 LAB — PULMONARY FUNCTION TEST ARMC ONLY
DL/VA % pred: 35 %
DL/VA: 1.44 ml/min/mmHg/L
DLCO unc % pred: 34 %
DLCO unc: 8.28 ml/min/mmHg
FEF 25-75 Post: 0.49 L/sec
FEF 25-75 Pre: 0.5 L/sec
FEF2575-%Change-Post: -1 %
FEF2575-%Pred-Post: 21 %
FEF2575-%Pred-Pre: 21 %
FEV1-%Change-Post: 13 %
FEV1-%Pred-Post: 40 %
FEV1-%Pred-Pre: 35 %
FEV1-Post: 1.21 L
FEV1-Pre: 1.07 L
FEV1FVC-%Change-Post: 13 %
FEV1FVC-%Pred-Pre: 44 %
FEV6-%Change-Post: 0 %
FEV6-%Pred-Post: 77 %
FEV6-%Pred-Pre: 76 %
FEV6-Post: 2.97 L
FEV6-Pre: 2.95 L
FEV6FVC-%Change-Post: 1 %
FEV6FVC-%Pred-Post: 97 %
FEV6FVC-%Pred-Pre: 95 %
FVC-%Change-Post: 0 %
FVC-%Pred-Post: 79 %
FVC-%Pred-Pre: 80 %
FVC-Post: 3.25 L
FVC-Pre: 3.28 L
Post FEV1/FVC ratio: 37 %
Post FEV6/FVC ratio: 91 %
Pre FEV1/FVC ratio: 33 %
Pre FEV6/FVC Ratio: 90 %
RV % pred: 59 %
RV: 1.4 L
TLC % pred: 80 %
TLC: 5.36 L

## 2022-05-02 MED ORDER — ALBUTEROL SULFATE (2.5 MG/3ML) 0.083% IN NEBU
2.5000 mg | INHALATION_SOLUTION | Freq: Once | RESPIRATORY_TRACT | Status: AC
  Administered 2022-05-02: 2.5 mg via RESPIRATORY_TRACT
  Filled 2022-05-02: qty 3

## 2022-05-03 ENCOUNTER — Encounter: Payer: Self-pay | Admitting: Primary Care

## 2022-05-08 ENCOUNTER — Ambulatory Visit
Admission: RE | Admit: 2022-05-08 | Discharge: 2022-05-08 | Disposition: A | Discharge status: Home / Self Care | Payer: Medicare HMO | Source: Ambulatory Visit | Attending: Family Medicine | Admitting: Family Medicine

## 2022-05-08 DIAGNOSIS — Z9981 Dependence on supplemental oxygen: Secondary | ICD-10-CM | POA: Diagnosis not present

## 2022-05-08 DIAGNOSIS — J449 Chronic obstructive pulmonary disease, unspecified: Secondary | ICD-10-CM | POA: Insufficient documentation

## 2022-05-08 DIAGNOSIS — R918 Other nonspecific abnormal finding of lung field: Secondary | ICD-10-CM | POA: Insufficient documentation

## 2022-05-08 DIAGNOSIS — R911 Solitary pulmonary nodule: Secondary | ICD-10-CM | POA: Insufficient documentation

## 2022-05-08 LAB — GLUCOSE, CAPILLARY: Glucose-Capillary: 117 mg/dL — ABNORMAL HIGH (ref 70–99)

## 2022-05-08 MED ORDER — FLUDEOXYGLUCOSE F - 18 (FDG) INJECTION
10.2200 | Freq: Once | INTRAVENOUS | Status: AC | PRN
  Administered 2022-05-08: 10.22 via INTRAVENOUS

## 2022-05-10 ENCOUNTER — Ambulatory Visit (INDEPENDENT_AMBULATORY_CARE_PROVIDER_SITE_OTHER): Payer: Medicare HMO | Admitting: Pulmonary Disease

## 2022-05-10 ENCOUNTER — Encounter: Payer: Self-pay | Admitting: Pulmonary Disease

## 2022-05-10 VITALS — BP 124/60 | HR 92 | Temp 98.2°F | Ht 68.0 in | Wt 217.0 lb

## 2022-05-10 DIAGNOSIS — R911 Solitary pulmonary nodule: Secondary | ICD-10-CM

## 2022-05-10 DIAGNOSIS — J449 Chronic obstructive pulmonary disease, unspecified: Secondary | ICD-10-CM

## 2022-05-10 DIAGNOSIS — C3491 Malignant neoplasm of unspecified part of right bronchus or lung: Secondary | ICD-10-CM

## 2022-05-10 DIAGNOSIS — J9611 Chronic respiratory failure with hypoxia: Secondary | ICD-10-CM | POA: Diagnosis not present

## 2022-05-10 DIAGNOSIS — Z87891 Personal history of nicotine dependence: Secondary | ICD-10-CM

## 2022-05-10 MED ORDER — BUDESONIDE 0.5 MG/2ML IN SUSP: 0.5000 mg | Freq: Two times a day (BID) | RESPIRATORY_TRACT | 2 refills | Status: AC

## 2022-05-10 NOTE — Patient Instructions (Signed)
So in [place] there are 2 groups of pulmonary medicine one is [facility] the other one is [facility].  Either of these groups should be fine.  We have sent a message to the radiation oncologist to see if he can start some radiation therapy to this area.  We have sent the prescription for your medicine to add to the nebulizer.  You can stop using the [employer].  We will see you in follow-up on an as-needed basis.

## 2022-05-10 NOTE — Progress Notes (Unsigned)
Subjective:    Patient ID: Jimmy Welch, male    DOB: 20-Dec-1950, 71 y.o.   MRN: [id number] Patient Care Team: [name], [place] as PCP - General [facility])  Chief Complaint  Patient presents with   Follow-up    SOB with exertion. Recent PET.    HPI [name]. Conley Canal is a 71 year old recent former smoker (quit 02/2022, 64 PY) with a history as noted below, who presents for evaluation of an abnormal low-dose lung cancer screening CT.  The patient has previously been seen here by Dr. Patricia Pesa on 08 February 2021.  At that time he was noted to have very severe COPD and chronic respiratory failure with hypoxia requiring anywhere between 2 to 5 L of oxygen via portable concentrator.  Patient is on nebulizer medications.  The patient at that time was enrolled in the lung cancer screening program.  Initial lung cancer screening CT was performed in March 2022 and showed a 4.2 mm right lower lobe nodule.  It was classified as that time as a lung RADS 2.  On 25 Apr 2022 the patient had a repeat lung cancer screening CT this shows now a 2.2 cm nodule in the right lower lobe.  PET/CT was obtained on 22 May this lesion to be FDG avid with an SUV max of 40.9 and hypermetabolic activity in the right hilum with an SUV of 5.0.  Adenopathy could not be measured on these images.  Patient had PFT on 02 May 2022 showing FEV1 of 1.0 L or 35%, FVC of 3.28 L or 80% with an FEV1/FVC of 33%.  There was significant bronchodilator response with FEV1 improving to 1.21 L or 40% predicted.  This represented a 13% net change.  Lung volumes were mildly reduced likely secondary to patient's significant truncal obesity.  Diffusion capacity severely reduced at 35%.  This is consistent with very severe COPD and mild restriction due to obesity.  The patient states that he is dyspnea is at baseline.  He relies mostly on DuoNeb nebulizers.  Has Breztri prescribed but does not note that this is very helpful.  He relies on the  nebulizers more than anything.  He does not endorse any orthopnea or paroxysmal nocturnal dyspnea.  Cough productive in the mornings mostly of whitish sputum.  No hemoptysis.  No chest pain.  No lower extremity edema.   Review of Systems A 10 point review of systems was performed and it is as noted above otherwise negative.  Past Medical History:  Diagnosis Date   COPD (chronic obstructive pulmonary disease) ([place])    Past Surgical History:  Procedure Laterality Date   NO PAST SURGERIES     Patient Active Problem List   Diagnosis Date Noted   COPD (chronic obstructive pulmonary disease) ([place]) 10/03/2021   Preop respiratory exam 10/03/2021   Chronic respiratory failure with hypoxia ([place] [address]) 10/03/2021   Family History  Problem Relation Age of Onset   Breast cancer Mother    Alcoholism Father    Social History   Tobacco Use   Smoking status: Former    Packs/day: 1.00    Years: 40.00    Pack years: 40.00    Types: Cigarettes    Quit date: 03/07/2022    Years since quitting: 0.1   Smokeless tobacco: Never  Substance Use Topics   Alcohol use: Yes    Comment: beers daily   Allergies  Allergen Reactions   Sulfa Antibiotics    Current Meds  Medication Sig  baclofen (LIORESAL) 10 MG tablet Take 10 mg by mouth 3 (three) times daily.   Budeson-Glycopyrrol-Formoterol (BREZTRI AEROSPHERE) 160-9-4.8 MCG/ACT AERO Inhale 2 puffs into the lungs in the morning and at bedtime.   cetirizine (ZYRTEC ALLERGY) 10 MG tablet Take 1 tablet (10 mg total) by mouth daily.   ipratropium-albuterol (DUONEB) 0.5-2.5 (3) MG/3ML SOLN Take by nebulization.   lisinopril (ZESTRIL) 20 MG tablet Take 20 mg by mouth daily.   OXYGEN Inhale into the lungs. 2-5 liters depending on activity   Immunization History  Administered Date(s) Administered   Moderna SARS-COV2 Booster Vaccination 01/05/2021   Moderna Sars-Covid-2 Vaccination 03/10/2020, 04/07/2020       Objective:   Physical Exam BP 124/60 (BP  Location: Left Arm)   Pulse 92   Temp 98.2 F (36.8 C) (Temporal)   Ht 5\' 8"  (1.727 m)   Wt 217 lb (98.4 kg)   SpO2 94%   BMI 32.99 kg/m  GENERAL: Obese male, presents in transport chair, use of accessories of respiration, mild conversational dyspnea.  On oxygen at 2 L pulse via POC. HEAD: Normocephalic, atraumatic.  EYES: Pupils equal, round, reactive to light.  No scleral icterus.  MOUTH: Poor dentition, oral mucosa moist, no thrush. NECK: Supple. No thyromegaly. Trachea midline. No JVD.  No adenopathy. PULMONARY: Distant breath sounds bilaterally.  Coarse, otherwise no adventitious sounds. CARDIOVASCULAR: S1 and S2. Regular rate and rhythm.  Distant cardiac tones, no rubs murmurs or gallops appreciated. ABDOMEN: Significant truncal obesity.  Otherwise benign. MUSCULOSKELETAL: No joint deformity, mild clubbing, no edema.  NEUROLOGIC: No overt focality.  Speech is fluent. SKIN: Intact,warm,dry. PSYCH: Mood and behavior normal.  Representative image from CT chest performed 25 Apr 2022:     Representative image from PET/CT performed 08 May 2022:   Assessment & Plan:     ICD-10-CM   1. Lung nodule 2.2 cm, right lower lobe  R91.1 Ambulatory referral to Radiation Oncology   Non-small cell cancer until proven otherwise Patient not a candidate for invasive procedures Referral to radiation oncology Consider empiric SBRT    2. Non-small cell cancer of right lung (HCC)  C34.91    By clinical presentation and imaging Patient not a candidate for invasive procedures    3. Stage 4 very severe COPD by GOLD classification ([place])  J44.9    Very severe COPD Discontinue Breztri due to no breath-holding capacity DuoNebs 4 times daily Pulmicort 0.5 mg twice daily via neb    4. Chronic respiratory failure with hypoxia (HCC)  J96.11    Continue oxygen at 2 L/min with rest Oxygen 5 L/min with exertion Oxygen 2 L/min during sleep    5. Former heavy tobacco smoker  Z87.891    With March  2023 No evidence of relapse     Orders Placed This Encounter  Procedures   Ambulatory referral to Radiation Oncology    Referral Priority:   Urgent    Referral Type:   Consultation    Referral Reason:   Specialty Services Required    Requested Specialty:   Radiation Oncology    Number of Visits Requested:   1   Meds ordered this encounter  Medications   budesonide (PULMICORT) 0.5 MG/2ML nebulizer solution    Sig: Take 2 mLs (0.5 mg total) by nebulization 2 (two) times daily.    Dispense:  360 mL    Refill:  2    J44.9   The patient has end-stage COPD with chronic respiratory failure with hypoxia.  The patient  is not a candidate for invasive procedures and therefore management of his lung nodule/mass will be empiric/palliative.  She will be referred to radiation oncology for consideration of potential SBRT.  As an aside the patient also states that he will be moving to the [facility] area at the beginning of July.  He will be moving in with his son and daughter-in-law.  I have recommended that they look into [facility] or [facility] for follow-up.  Follow-up at this clinic will be on an as-needed basis.   Renold Don, MD Advanced Bronchoscopy PCCM [facility]    *This note was dictated using voice recognition software/Dragon.  Despite best efforts to proofread, errors can occur which can change the meaning. Any transcriptional errors that result from this process are unintentional and may not be fully corrected at the time of dictation.

## 2022-05-17 ENCOUNTER — Ambulatory Visit: Payer: Medicare Other | Admitting: Internal Medicine

## 2022-05-19 ENCOUNTER — Encounter: Payer: Self-pay | Admitting: Radiation Oncology

## 2022-05-19 ENCOUNTER — Ambulatory Visit
Admission: RE | Admit: 2022-05-19 | Discharge: 2022-05-19 | Disposition: A | Discharge status: Home / Self Care | Payer: Medicare HMO | Source: Ambulatory Visit | Attending: Radiation Oncology | Admitting: Radiation Oncology

## 2022-05-19 VITALS — BP 138/77 | HR 83 | Temp 98.2°F | Resp 20 | Ht 68.0 in | Wt 210.6 lb

## 2022-05-19 DIAGNOSIS — R911 Solitary pulmonary nodule: Secondary | ICD-10-CM | POA: Insufficient documentation

## 2022-05-19 DIAGNOSIS — Z993 Dependence on wheelchair: Secondary | ICD-10-CM | POA: Insufficient documentation

## 2022-05-19 DIAGNOSIS — R059 Cough, unspecified: Secondary | ICD-10-CM | POA: Diagnosis not present

## 2022-05-19 DIAGNOSIS — Z79899 Other long term (current) drug therapy: Secondary | ICD-10-CM | POA: Diagnosis not present

## 2022-05-19 DIAGNOSIS — Z87891 Personal history of nicotine dependence: Secondary | ICD-10-CM | POA: Insufficient documentation

## 2022-05-19 DIAGNOSIS — J9611 Chronic respiratory failure with hypoxia: Secondary | ICD-10-CM | POA: Insufficient documentation

## 2022-05-19 DIAGNOSIS — Z803 Family history of malignant neoplasm of breast: Secondary | ICD-10-CM | POA: Insufficient documentation

## 2022-05-19 DIAGNOSIS — C3491 Malignant neoplasm of unspecified part of right bronchus or lung: Secondary | ICD-10-CM

## 2022-05-19 NOTE — Consult Note (Signed)
NEW PATIENT EVALUATION  Name: Jimmy Welch  MRN: [id number]  Date:   05/19/2022     DOB: 07/16/1951   This 71 y.o. male patient presents to the clinic for initial evaluation of stage I non-small cell lung cancer of the right lower lobe and patient with significantly compromised pulmonary function.  REFERRING PHYSICIAN: [name], [place]  CHIEF COMPLAINT:  Chief Complaint  Patient presents with   Lung Cancer    DIAGNOSIS: There were no encounter diagnoses.   PREVIOUS INVESTIGATIONS:  PET/CT and CT scans reviewed Labs reviewed Clinical notes reviewed  HPI: Patient is a 71 year old male followed by Dr. Patsey Berthold for severe COPD emphysema as well as chronic respiratory failure with hypoxia.  Patient is on continuous O2 2 L/min.  She has been tracking a spiculated nodule which is progressing over time now measuring approximate 2 cm.  Patient also had a PET scan showing hypermetabolic activity in this node consistent with primary bronchogenic carcinoma.  Patient has a mild nonproductive cough no chest tightness.  He is on continuous nasal oxygen.  He is wheelchair-bound.  He is seen today for consideration of empiric SBRT treatment.  PLANNED TREATMENT REGIMEN: SBRT  PAST MEDICAL HISTORY:  has a past medical history of COPD (chronic obstructive pulmonary disease) ([place]).    PAST SURGICAL HISTORY:  Past Surgical History:  Procedure Laterality Date   NO PAST SURGERIES      FAMILY HISTORY: family history includes Alcoholism in his father; Breast cancer in his mother.  SOCIAL HISTORY:  reports that he quit smoking about 2 months ago. His smoking use included cigarettes. He has a 40.00 pack-year smoking history. He has never used smokeless tobacco. He reports current alcohol use. He reports that he does not currently use drugs.  ALLERGIES: Sulfa antibiotics  MEDICATIONS:  Current Outpatient Medications  Medication Sig Dispense Refill   budesonide (PULMICORT) 0.5 MG/2ML nebulizer  solution Take 2 mLs (0.5 mg total) by nebulization 2 (two) times daily. 360 mL 2   ipratropium-albuterol (DUONEB) 0.5-2.5 (3) MG/3ML SOLN Take by nebulization.     lisinopril (ZESTRIL) 20 MG tablet Take 20 mg by mouth daily.     OXYGEN Inhale into the lungs. 2-5 liters depending on activity     No current facility-administered medications for this encounter.    ECOG PERFORMANCE STATUS:  0 - Asymptomatic  REVIEW OF SYSTEMS: Patient has COPD emphysema Patient denies any weight loss, fatigue, weakness, fever, chills or night sweats. Patient denies any loss of vision, blurred vision. Patient denies any ringing  of the ears or hearing loss. No irregular heartbeat. Patient denies heart murmur or history of fainting. Patient denies any chest pain or pain radiating to her upper extremities. Patient denies any shortness of breath, difficulty breathing at night, cough or hemoptysis. Patient denies any swelling in the lower legs. Patient denies any nausea vomiting, vomiting of blood, or coffee ground material in the vomitus. Patient denies any stomach pain. Patient states has had normal bowel movements no significant constipation or diarrhea. Patient denies any dysuria, hematuria or significant nocturia. Patient denies any problems walking, swelling in the joints or loss of balance. Patient denies any skin changes, loss of hair or loss of weight. Patient denies any excessive worrying or anxiety or significant depression. Patient denies any problems with insomnia. Patient denies excessive thirst, polyuria, polydipsia. Patient denies any swollen glands, patient denies easy bruising or easy bleeding. Patient denies any recent infections, allergies or URI. Patient "s visual fields have not changed significantly  in recent time.   PHYSICAL EXAM: BP 138/77 (BP Location: Left Arm, Patient Position: Sitting, Cuff Size: Large)   Pulse 83   Temp 98.2 F (36.8 C)   Resp 20   Ht 5\' 8"  (1.727 m)   Wt 210 lb 9.6 oz (95.5  kg)   SpO2 96%   BMI 32.02 kg/m  Wheelchair-bound male and on nasal oxygen in NAD.  Well-developed well-nourished patient in NAD. HEENT reveals PERLA, EOMI, discs not visualized.  Oral cavity is clear. No oral mucosal lesions are identified. Neck is clear without evidence of cervical or supraclavicular adenopathy. Lungs are clear to A&P. Cardiac examination is essentially unremarkable with regular rate and rhythm without murmur rub or thrill. Abdomen is benign with no organomegaly or masses noted. Motor sensory and DTR levels are equal and symmetric in the upper and lower extremities. Cranial nerves II through XII are grossly intact. Proprioception is intact. No peripheral adenopathy or edema is identified. No motor or sensory levels are noted. Crude visual fields are within normal range.  LABORATORY DATA: Labs reviewed    RADIOLOGY RESULTS: CT scans and PET CT scans reviewed compatible with above-stated findings   IMPRESSION: Stage I non-small cell lung cancer of the right lower lobe in 71 year old male  PLAN: Patient actually has 1 hilar node which is PET positive although this could be  contiguous with the primary tumor and would still be encompassed in her SBRT treatment field.  I have recommended SBRT 60 Gray in 5 fractions.  Would use for dimensional treatment planning as well as motion restriction for simulation.  Risks and benefits of treatment including extremely low side effect profile possibly development of cough fatigue.  Patient comprehends my recommendations well.  I have set him up for CT simulation next week.  Patient is moving in the beginning of July to [place] and has already been recommended for pulmonary follow-up there.  I would like to take this opportunity to thank you for allowing me to participate in the care of your patient.Noreene Filbert, MD

## 2022-05-24 ENCOUNTER — Ambulatory Visit
Admission: RE | Admit: 2022-05-24 | Discharge: 2022-05-24 | Disposition: A | Discharge status: Home / Self Care | Payer: Medicare HMO | Source: Ambulatory Visit | Attending: Radiation Oncology | Admitting: Radiation Oncology

## 2022-05-24 DIAGNOSIS — Z510 Encounter for antineoplastic radiation therapy: Secondary | ICD-10-CM | POA: Diagnosis not present

## 2022-05-24 DIAGNOSIS — R911 Solitary pulmonary nodule: Secondary | ICD-10-CM | POA: Diagnosis present

## 2022-05-30 DIAGNOSIS — Z510 Encounter for antineoplastic radiation therapy: Secondary | ICD-10-CM | POA: Diagnosis not present

## 2022-06-06 ENCOUNTER — Other Ambulatory Visit: Payer: Self-pay

## 2022-06-06 ENCOUNTER — Ambulatory Visit
Admission: RE | Admit: 2022-06-06 | Discharge: 2022-06-06 | Disposition: A | Discharge status: Home / Self Care | Payer: Medicare HMO | Source: Ambulatory Visit | Attending: Radiation Oncology | Admitting: Radiation Oncology

## 2022-06-06 DIAGNOSIS — Z510 Encounter for antineoplastic radiation therapy: Secondary | ICD-10-CM | POA: Diagnosis not present

## 2022-06-06 LAB — RAD ONC ARIA SESSION SUMMARY
Course Elapsed Days: 0
Plan Fractions Treated to Date: 1
Plan Prescribed Dose Per Fraction: 12 Gy
Plan Total Fractions Prescribed: 5
Plan Total Prescribed Dose: 60 Gy
Reference Point Dosage Given to Date: 12 Gy
Reference Point Session Dosage Given: 12 Gy
Session Number: 1

## 2022-06-08 ENCOUNTER — Other Ambulatory Visit: Payer: Self-pay

## 2022-06-08 ENCOUNTER — Ambulatory Visit
Admission: RE | Admit: 2022-06-08 | Discharge: 2022-06-08 | Disposition: A | Discharge status: Home / Self Care | Payer: Medicare HMO | Source: Ambulatory Visit | Attending: Radiation Oncology | Admitting: Radiation Oncology

## 2022-06-08 DIAGNOSIS — Z510 Encounter for antineoplastic radiation therapy: Secondary | ICD-10-CM | POA: Diagnosis not present

## 2022-06-08 LAB — RAD ONC ARIA SESSION SUMMARY
Course Elapsed Days: 2
Plan Fractions Treated to Date: 2
Plan Prescribed Dose Per Fraction: 12 Gy
Plan Total Fractions Prescribed: 5
Plan Total Prescribed Dose: 60 Gy
Reference Point Dosage Given to Date: 24 Gy
Reference Point Session Dosage Given: 12 Gy
Session Number: 2

## 2022-06-13 ENCOUNTER — Ambulatory Visit
Admission: RE | Admit: 2022-06-13 | Discharge: 2022-06-13 | Disposition: A | Discharge status: Home / Self Care | Payer: Medicare HMO | Source: Ambulatory Visit | Attending: Radiation Oncology | Admitting: Radiation Oncology

## 2022-06-13 ENCOUNTER — Other Ambulatory Visit: Payer: Self-pay

## 2022-06-13 DIAGNOSIS — Z510 Encounter for antineoplastic radiation therapy: Secondary | ICD-10-CM | POA: Diagnosis not present

## 2022-06-13 LAB — RAD ONC ARIA SESSION SUMMARY
Course Elapsed Days: 7
Plan Fractions Treated to Date: 3
Plan Prescribed Dose Per Fraction: 12 Gy
Plan Total Fractions Prescribed: 5
Plan Total Prescribed Dose: 60 Gy
Reference Point Dosage Given to Date: 36 Gy
Reference Point Session Dosage Given: 12 Gy
Session Number: 3

## 2022-06-15 ENCOUNTER — Ambulatory Visit
Admission: RE | Admit: 2022-06-15 | Discharge: 2022-06-15 | Disposition: A | Discharge status: Home / Self Care | Payer: Medicare HMO | Source: Ambulatory Visit | Attending: Radiation Oncology | Admitting: Radiation Oncology

## 2022-06-15 ENCOUNTER — Other Ambulatory Visit: Payer: Self-pay

## 2022-06-15 DIAGNOSIS — Z510 Encounter for antineoplastic radiation therapy: Secondary | ICD-10-CM | POA: Diagnosis not present

## 2022-06-15 LAB — RAD ONC ARIA SESSION SUMMARY
Course Elapsed Days: 9
Plan Fractions Treated to Date: 4
Plan Prescribed Dose Per Fraction: 12 Gy
Plan Total Fractions Prescribed: 5
Plan Total Prescribed Dose: 60 Gy
Reference Point Dosage Given to Date: 48 Gy
Reference Point Session Dosage Given: 12 Gy
Session Number: 4

## 2022-06-19 ENCOUNTER — Ambulatory Visit
Admission: RE | Admit: 2022-06-19 | Discharge: 2022-06-19 | Disposition: A | Discharge status: Home / Self Care | Payer: Medicare HMO | Source: Ambulatory Visit | Attending: Radiation Oncology | Admitting: Radiation Oncology

## 2022-06-19 ENCOUNTER — Other Ambulatory Visit: Payer: Self-pay

## 2022-06-19 DIAGNOSIS — R911 Solitary pulmonary nodule: Secondary | ICD-10-CM | POA: Diagnosis present

## 2022-06-19 DIAGNOSIS — Z510 Encounter for antineoplastic radiation therapy: Secondary | ICD-10-CM | POA: Diagnosis not present

## 2022-06-19 LAB — RAD ONC ARIA SESSION SUMMARY
Course Elapsed Days: 13
Plan Fractions Treated to Date: 5
Plan Prescribed Dose Per Fraction: 12 Gy
Plan Total Fractions Prescribed: 5
Plan Total Prescribed Dose: 60 Gy
Reference Point Dosage Given to Date: 60 Gy
Reference Point Session Dosage Given: 12 Gy
Session Number: 5

## 2022-07-27 ENCOUNTER — Other Ambulatory Visit: Payer: Self-pay | Admitting: *Deleted

## 2022-07-27 ENCOUNTER — Ambulatory Visit
Admission: RE | Admit: 2022-07-27 | Discharge: 2022-07-27 | Disposition: A | Discharge status: Home / Self Care | Payer: Medicare HMO | Source: Ambulatory Visit | Attending: Radiation Oncology | Admitting: Radiation Oncology

## 2022-07-27 VITALS — BP 143/74 | HR 108 | Temp 98.3°F | Resp 18 | Ht 68.0 in | Wt 210.0 lb

## 2022-07-27 DIAGNOSIS — C3491 Malignant neoplasm of unspecified part of right bronchus or lung: Secondary | ICD-10-CM

## 2022-07-27 DIAGNOSIS — C3431 Malignant neoplasm of lower lobe, right bronchus or lung: Secondary | ICD-10-CM

## 2022-07-27 NOTE — Progress Notes (Signed)
Radiation Oncology Follow up Note  Name: Jimmy Welch   Date:   07/27/2022 MRN:  [id number] DOB: 05/01/1951    This 71 y.o. male presents to the clinic today for 1 month follow-up status post SBRT for stage I non-small cell lung cancer of the right lower lobe.  REFERRING PROVIDER: [name], PA  HPI: Patient is a 71 year old male with compromised pulmonary function 1 month out from SBRT to his right lower lobe for stage I non-small cell lung cancer.  Seen today in routine follow-up he is doing well no change in his pulmonary status he is on nasal oxygen.  He is starting to exercise more on his treadmill.  Specifically [place] cough hemoptysis or chest tightness..  COMPLICATIONS OF TREATMENT: none  FOLLOW UP COMPLIANCE: keeps appointments   PHYSICAL EXAM:  BP (!) 143/74   Pulse (!) 108   Temp 98.3 F (36.8 C)   Resp 18   Ht 5\' 8"  (1.727 m)   Wt 210 lb (95.3 kg)   BMI 31.93 kg/m  Wheelchair-bound male on nasal oxygen in NAD.  Well-developed well-nourished patient in NAD. HEENT reveals PERLA, EOMI, discs not visualized.  Oral cavity is clear. No oral mucosal lesions are identified. Neck is clear without evidence of cervical or supraclavicular adenopathy. Lungs are clear to A&P. Cardiac examination is essentially unremarkable with regular rate and rhythm without murmur rub or thrill. Abdomen is benign with no organomegaly or masses noted. Motor sensory and DTR levels are equal and symmetric in the upper and lower extremities. Cranial nerves II through XII are grossly intact. Proprioception is intact. No peripheral adenopathy or edema is identified. No motor or sensory levels are noted. Crude visual fields are within normal range.  RADIOLOGY RESULTS: CT scan ordered in 3 months  PLAN: Present time patient is doing well no change in his pulmonary status since SBRT.  Very low side effect profile.  I have asked to see him back in 3 months we will obtain a CT scan prior to that visit.   Patient is to call with any concerns.  I would like to take this opportunity to thank you for allowing me to participate in the care of your patient.Noreene Filbert, MD

## 2022-09-03 IMAGING — PT NM PET TUM IMG INITIAL (PI) SKULL BASE T - THIGH
7 series · 25 of 25 positions shown · non-contrast
Comparison: Lung cancer screening CT dated March 04, 2021

CLINICAL DATA: Initial treatment strategy for pulmonary nodule.

EXAM:
NUCLEAR MEDICINE PET SKULL BASE TO THIGH
TECHNIQUE: 10.2 mCi F-18 FDG was injected intravenously. Full-ring PET imaging
was performed from the skull base to thigh after the radiotracer. CT
data was obtained and used for attenuation correction and anatomic
localization.
Fasting blood glucose: 117 mg/dl

[Series 2: ct slices · axial · 3.8mm · 1.37mm/px · z∈[-974,+0]mm · 6 of 299 slices shown]
[im 1/299]
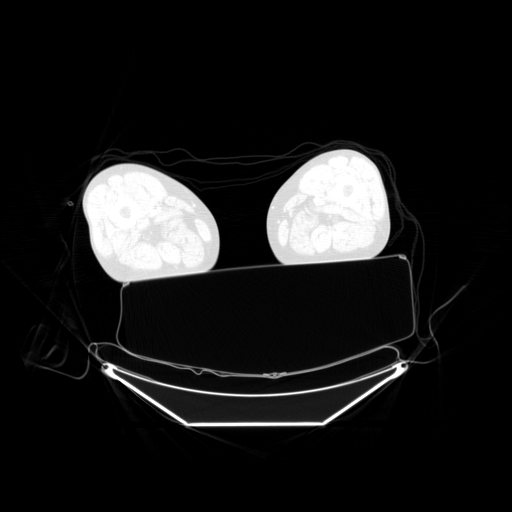
[im 60/299  brain]
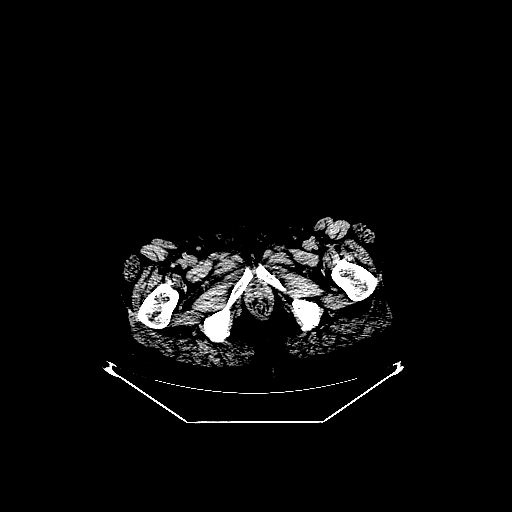
[im 120/299]
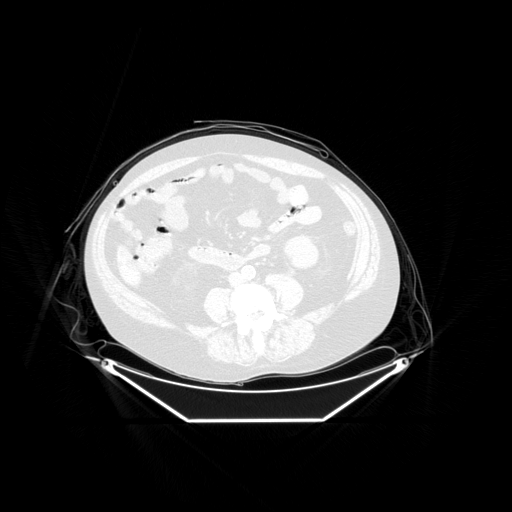
[im 179/299]
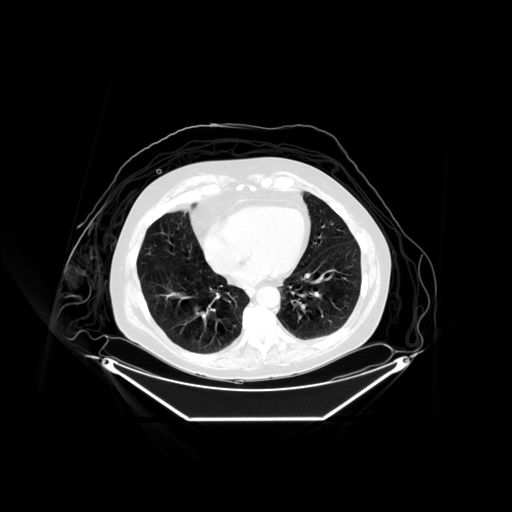
[im 239/299]
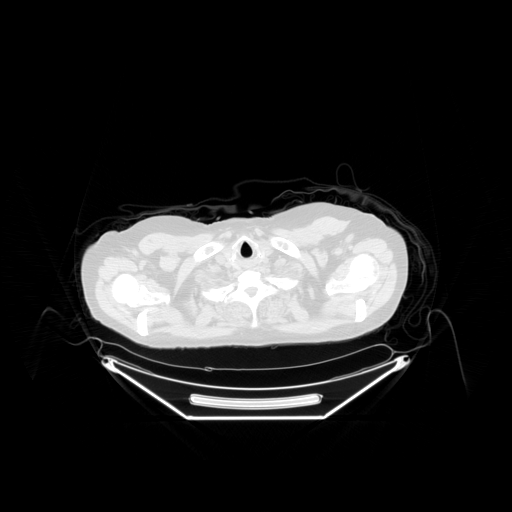
[im 299/299]
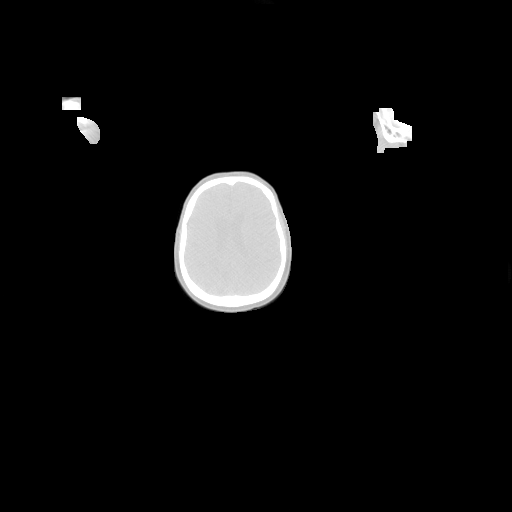

[Series 3: pet ac 3d body · axial · 3.3mm · 5.47mm/px · z∈[-974,+0]mm · 5 of 299 slices shown]
[im 1/299]
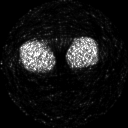
[im 75/299]
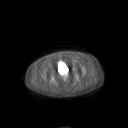
[im 150/299]
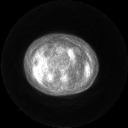
[im 224/299]
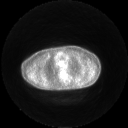
[im 299/299]
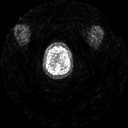

[Series 4: pet nac 3d body · axial · 3.3mm · 5.47mm/px · z∈[-974,+0]mm · 5 of 299 slices shown]
[im 1/299]
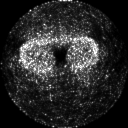
[im 75/299]
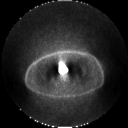
[im 150/299]
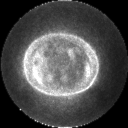
[im 224/299]
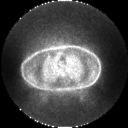
[im 299/299]
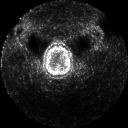

[Series 303: pet axial · axial · 3.3mm · 5.47mm/px · z∈[-974,+0]mm · 5 of 299 slices shown]
[im 1/299]
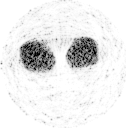
[im 75/299]
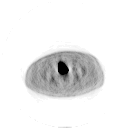
[im 150/299]
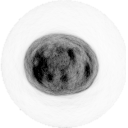
[im 224/299]
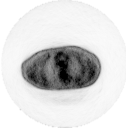
[im 299/299]
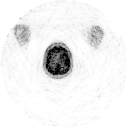

[Series 304: pet sagittal · sagittal · 5.5mm · 7.82mm/px · 2 of 94 slices shown]
[im 1/94]
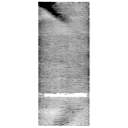
[im 94/94]
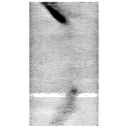

[Series 305: pet coronal · coronal · 5.5mm · 7.82mm/px · 1 of 76 slices shown]
[im 1/76]
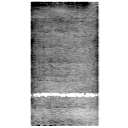

[Series 1060: results mm oncology reading · 1.4mm · 0.70mm/px · 1 of 5 slices shown]
[im 1/5]
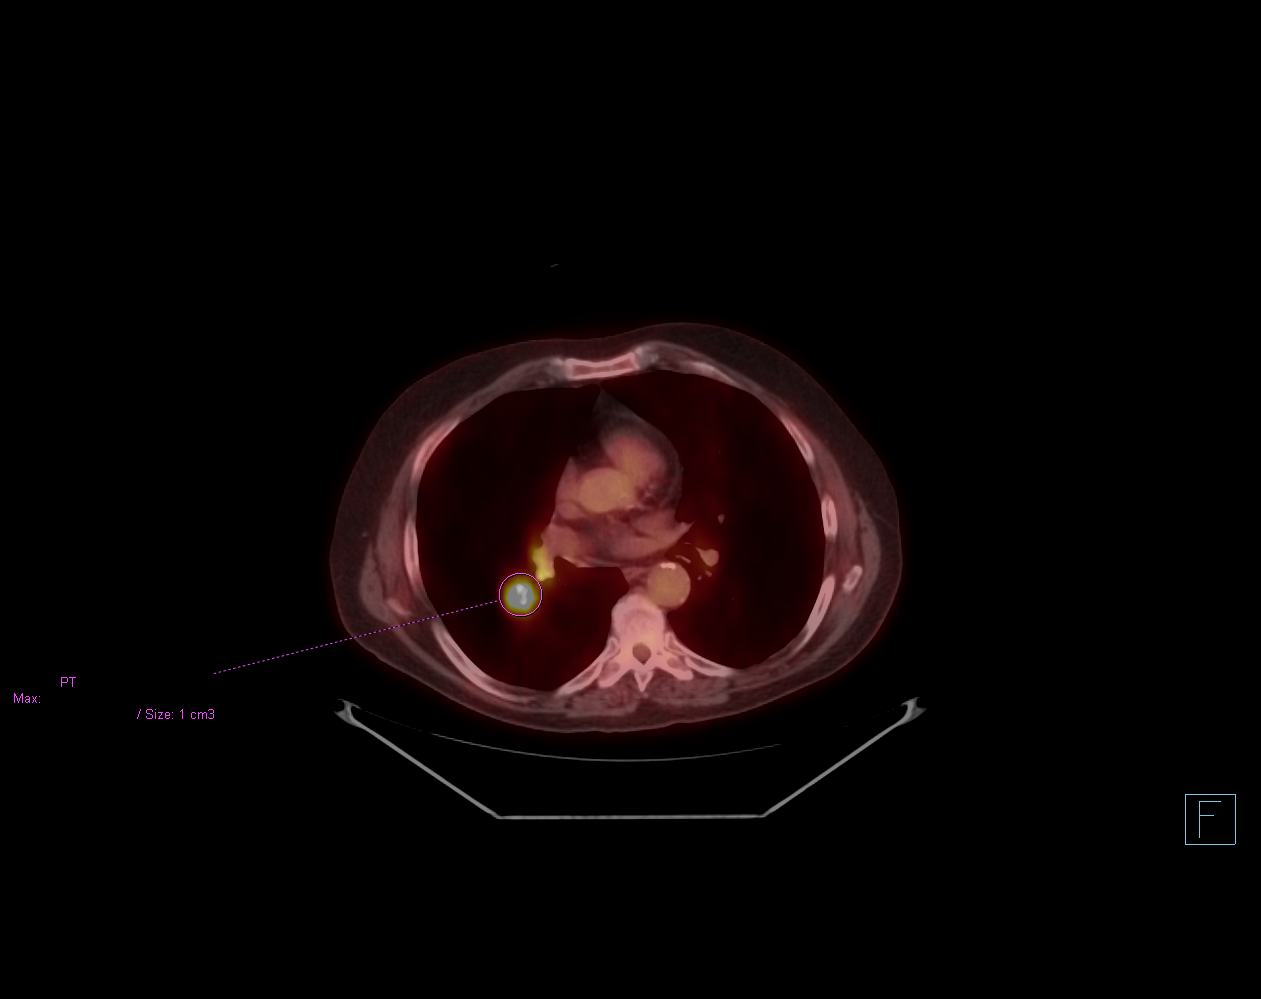

[25 of 25 positions shown; findings below may reference images not displayed]

FINDINGS: Mediastinal blood pool activity: SUV max

Liver activity: SUV max

NECK: No hypermetabolic lymph nodes in the neck.

Incidental CT findings: none

CHEST: Hypermetabolic spiculated perifissural solid right lower lobe
pulmonary nodule is unchanged in size when compared with recent
chest CT, SUV max of 13.5. Focal hypermetabolic activity is seen at
the right hilum, SUV max of 5.0. Difficult to measure discrete hilar
lymph nodes due to lack of IV contrast.

Incidental CT findings: Left main and three-vessel coronary artery
calcifications. Atherosclerotic disease of the thoracic aorta.
Centrilobular emphysema.

ABDOMEN/PELVIS: No abnormal hypermetabolic activity within the
liver, pancreas, adrenal glands, or spleen. No hypermetabolic lymph
nodes in the abdomen or pelvis. Focal wall thickening and
hypermetabolic activity of the sigmoid colon with SUV max of 10.0.

Incidental CT findings: Numerous cystic lesions are seen throughout
the liver with no FDG uptake, likely simple cysts. Diverticulosis.
Atherosclerotic disease of the abdominal aorta.

SKELETON: No focal hypermetabolic activity to suggest skeletal
metastasis.

Incidental CT findings: none
IMPRESSION: 1. Hypermetabolic solid spiculated nodule of the right lower lobe,
highly concerning for primary lung malignancy.
2. Focal hypermetabolic activity is seen at the right hilum,
concerning for metastatic disease. Difficult to measure discrete
hilar lymph nodes due to lack of IV contrast.
3. No evidence of metastatic disease in the abdomen or pelvis.
4. Focal wall thickening and hypermetabolic activity of the sigmoid
colon, concerning for primary colonic malignancy. Recommend
colonoscopy for further evaluation.

## 2022-10-27 ENCOUNTER — Other Ambulatory Visit: Payer: Medicare HMO

## 2022-11-01 ENCOUNTER — Ambulatory Visit: Payer: Medicare HMO | Admitting: Radiation Oncology
